# Patient Record
Sex: Male | Born: 1975 | Race: Black or African American | Hispanic: No | Marital: Single | Smoking: Former smoker
Health system: Southern US, Community
[De-identification: ages and names within clinical notes are randomized; demographics above are authoritative.]

## PROBLEM LIST (undated history)

## (undated) DIAGNOSIS — K219 Gastro-esophageal reflux disease without esophagitis: Secondary | ICD-10-CM

## (undated) HISTORY — PX: FEMUR FRACTURE SURGERY: SHX633

---

## 2014-01-30 ENCOUNTER — Encounter (HOSPITAL_COMMUNITY): Payer: Self-pay | Admitting: *Deleted

## 2014-01-30 ENCOUNTER — Emergency Department (HOSPITAL_COMMUNITY)
Admission: EM | Admit: 2014-01-30 | Discharge: 2014-01-30 | Disposition: A | Discharge status: Home / Self Care | Payer: Self-pay | Attending: Emergency Medicine | Admitting: Emergency Medicine

## 2014-01-30 DIAGNOSIS — K219 Gastro-esophageal reflux disease without esophagitis: Secondary | ICD-10-CM | POA: Insufficient documentation

## 2014-01-30 DIAGNOSIS — R109 Unspecified abdominal pain: Secondary | ICD-10-CM | POA: Insufficient documentation

## 2014-01-30 DIAGNOSIS — Z79899 Other long term (current) drug therapy: Secondary | ICD-10-CM | POA: Insufficient documentation

## 2014-01-30 DIAGNOSIS — R112 Nausea with vomiting, unspecified: Secondary | ICD-10-CM | POA: Insufficient documentation

## 2014-01-30 HISTORY — DX: Gastro-esophageal reflux disease without esophagitis: K21.9

## 2014-01-30 LAB — URINALYSIS, ROUTINE W REFLEX MICROSCOPIC
Bilirubin Urine: NEGATIVE
Glucose, UA: NEGATIVE mg/dL
Ketones, ur: NEGATIVE mg/dL
LEUKOCYTES UA: NEGATIVE
Nitrite: NEGATIVE
Protein, ur: 100 mg/dL — AB
SPECIFIC GRAVITY, URINE: 1.027 (ref 1.005–1.030)
UROBILINOGEN UA: 0.2 mg/dL (ref 0.0–1.0)
pH: 7.5 (ref 5.0–8.0)

## 2014-01-30 LAB — CBC WITH DIFFERENTIAL/PLATELET
BASOS PCT: 0 % (ref 0–1)
Basophils Absolute: 0 10*3/uL (ref 0.0–0.1)
Eosinophils Absolute: 0.1 10*3/uL (ref 0.0–0.7)
Eosinophils Relative: 1 % (ref 0–5)
HCT: 41.2 % (ref 39.0–52.0)
Hemoglobin: 13.9 g/dL (ref 13.0–17.0)
Lymphocytes Relative: 21 % (ref 12–46)
Lymphs Abs: 1.9 10*3/uL (ref 0.7–4.0)
MCH: 30.3 pg (ref 26.0–34.0)
MCHC: 33.7 g/dL (ref 30.0–36.0)
MCV: 90 fL (ref 78.0–100.0)
Monocytes Absolute: 1 10*3/uL (ref 0.1–1.0)
Monocytes Relative: 11 % (ref 3–12)
NEUTROS PCT: 67 % (ref 43–77)
Neutro Abs: 6.1 10*3/uL (ref 1.7–7.7)
Platelets: 213 10*3/uL (ref 150–400)
RBC: 4.58 MIL/uL (ref 4.22–5.81)
RDW: 12.7 % (ref 11.5–15.5)
WBC: 9.1 10*3/uL (ref 4.0–10.5)

## 2014-01-30 LAB — URINE MICROSCOPIC-ADD ON

## 2014-01-30 LAB — COMPREHENSIVE METABOLIC PANEL
ALBUMIN: 3.8 g/dL (ref 3.5–5.2)
ALT: 24 U/L (ref 0–53)
ANION GAP: 13 (ref 5–15)
AST: 25 U/L (ref 0–37)
Alkaline Phosphatase: 45 U/L (ref 39–117)
BUN: 18 mg/dL (ref 6–23)
CO2: 25 mEq/L (ref 19–32)
Calcium: 9.3 mg/dL (ref 8.4–10.5)
Chloride: 100 mEq/L (ref 96–112)
Creatinine, Ser: 0.75 mg/dL (ref 0.50–1.35)
GFR calc Af Amer: >90 mL/min (ref >90)
GFR calc non Af Amer: >90 mL/min (ref >90)
Glucose, Bld: 133 mg/dL — ABNORMAL HIGH (ref 70–99)
POTASSIUM: 4.5 meq/L (ref 3.7–5.3)
SODIUM: 138 meq/L (ref 137–147)
TOTAL PROTEIN: 7.1 g/dL (ref 6.0–8.3)
Total Bilirubin: 0.3 mg/dL (ref 0.3–1.2)

## 2014-01-30 LAB — LIPASE, BLOOD: Lipase: 21 U/L (ref 11–59)

## 2014-01-30 MED ORDER — PROMETHAZINE HCL 25 MG PO TABS: 25.0000 mg | ORAL_TABLET | Freq: Four times a day (QID) | ORAL | Status: AC | PRN

## 2014-01-30 MED ORDER — ONDANSETRON HCL 4 MG/2ML IJ SOLN
4.0000 mg | Freq: Once | INTRAMUSCULAR | Status: AC
  Administered 2014-01-30: 4 mg via INTRAVENOUS
  Filled 2014-01-30: qty 2

## 2014-01-30 MED ORDER — SODIUM CHLORIDE 0.9 % IV BOLUS (SEPSIS)
1000.0000 mL | Freq: Once | INTRAVENOUS | Status: AC
  Administered 2014-01-30: 1000 mL via INTRAVENOUS

## 2014-01-30 MED ORDER — KETOROLAC TROMETHAMINE 30 MG/ML IJ SOLN
30.0000 mg | Freq: Once | INTRAMUSCULAR | Status: AC
Start: 2014-01-30 — End: 2014-01-30
  Administered 2014-01-30: 30 mg via INTRAVENOUS
  Filled 2014-01-30: qty 1

## 2014-01-30 NOTE — ED Provider Notes (Signed)
CSN: [id number]     Arrival date & time 01/30/14  0501 History   First MD Initiated Contact with Patient 01/30/14 [phone]     Chief Complaint  Patient presents with  . Headache  . Emesis     (Consider location/radiation/quality/duration/timing/severity/associated sxs/prior Treatment) HPI Comments: Patient is a 38 year old male who presents with complaints of nausea, vomiting, and diarrhea that woke him abruptly from sleep several hours ago. He states he woke with his abdomen feeling "firm", then had to go to the bathroom and vomit. He denies any fevers or chills. He denies any bloody emesis or stool.  Patient is a 38 y.o. male presenting with vomiting. The history is provided by the patient.  Emesis Severity:  Moderate Duration:  2 hours Timing:  Constant Progression:  Improving Chronicity:  New Relieved by:  Nothing Worsened by:  Nothing tried Ineffective treatments:  None tried Associated symptoms: abdominal pain     Past Medical History  Diagnosis Date  . Acid reflux    History reviewed. No pertinent past surgical history. History reviewed. No pertinent family history. History  Substance Use Topics  . Smoking status: Not on file  . Smokeless tobacco: Not on file  . Alcohol Use: No    Review of Systems  Gastrointestinal: Positive for vomiting and abdominal pain.  All other systems reviewed and are negative.     Allergies  Eggs or egg-derived products  Home Medications   Prior to Admission medications   Medication Sig Start Date End Date Taking? Authorizing Provider  lansoprazole (PREVACID) 15 MG capsule Take 15 mg by mouth daily.   Yes Historical Provider, MD   BP 137/78 mmHg  Pulse 75  Temp(Src) 98.6 F (37 C) (Oral)  Resp 18  Ht 5\' 11"  (1.803 m)  SpO2 95% Physical Exam  Constitutional: He is oriented to person, place, and time. He appears well-developed and well-nourished. No distress.  HENT:  Head: Normocephalic and atraumatic.  Mouth/Throat:  Oropharynx is clear and moist.  Neck: Normal range of motion. Neck supple.  Cardiovascular: Normal rate, regular rhythm and normal heart sounds.   No murmur heard. Pulmonary/Chest: Effort normal and breath sounds normal. No respiratory distress. He has no wheezes.  Abdominal: Soft. Bowel sounds are normal. He exhibits no distension. There is tenderness. There is no rebound and no guarding.  There is mild tenderness to palpation in the suprapubic region and right lower quadrant. There is no rebound and no guarding.  Musculoskeletal: Normal range of motion. He exhibits no edema.  Lymphadenopathy:    He has no cervical adenopathy.  Neurological: He is alert and oriented to person, place, and time.  Skin: Skin is warm and dry. He is not diaphoretic.  Nursing note and vitals reviewed.   ED Course  Procedures (including critical care time) Labs Review Labs Reviewed  CBC WITH DIFFERENTIAL  COMPREHENSIVE METABOLIC PANEL  LIPASE, BLOOD  URINALYSIS, ROUTINE W REFLEX MICROSCOPIC    Imaging Review No results found.   EKG Interpretation None      MDM   Final diagnoses:  None    Patient presents with nausea, vomiting, and diarrhea. His presentation, exam, and workup is consistent with a viral etiology. He is feeling better with fluids and medications and I believe is appropriate for discharge. He understands to return if his symptoms worsen or change.    Geoffery Lyonsouglas Yatzari Jonsson, MD 01/30/14 [phone]

## 2014-01-30 NOTE — Discharge Instructions (Signed)
Phenergan as needed for nausea.  Return to the emergency department if you develop worsening pain, high fever, bloody stool, or other new and concerning symptoms.   Abdominal Pain Many things can cause belly (abdominal) pain. Most times, the belly pain is not dangerous. Many cases of belly pain can be watched and treated at home. HOME CARE   Do not take medicines that help you go poop (laxatives) unless told to by your doctor.  Only take medicine as told by your doctor.  Eat or drink as told by your doctor. Your doctor will tell you if you should be on a special diet. GET HELP IF:  You do not know what is causing your belly pain.  You have belly pain while you are sick to your stomach (nauseous) or have runny poop (diarrhea).  You have pain while you pee or poop.  Your belly pain wakes you up at night.  You have belly pain that gets worse or better when you eat.  You have belly pain that gets worse when you eat fatty foods.  You have a fever. GET HELP RIGHT AWAY IF:   The pain does not go away within 2 hours.  You keep throwing up (vomiting).  The pain changes and is only in the right or left part of the belly.  You have bloody or tarry looking poop. MAKE SURE YOU:   Understand these instructions.  Will watch your condition.  Will get help right away if you are not doing well or get worse. Document Released: 08/26/2007 Document Revised: 03/14/2013 Document Reviewed: 11/16/2012 [facility] Patient Information 2015 [place] [place]. This information is not intended to replace advice given to you by your health care provider. Make sure you discuss any questions you have with your health care provider.

## 2014-01-30 NOTE — ED Notes (Signed)
Pt reports waking up this am with heavy sensation to mid abd and having n/v/d.

## 2014-03-08 ENCOUNTER — Ambulatory Visit: Payer: Self-pay | Admitting: Internal Medicine

## 2014-05-21 ENCOUNTER — Emergency Department (HOSPITAL_COMMUNITY)
Admission: EM | Admit: 2014-05-21 | Discharge: 2014-05-21 | Disposition: A | Discharge status: Home / Self Care | Payer: Self-pay | Attending: Emergency Medicine | Admitting: Emergency Medicine

## 2014-05-21 ENCOUNTER — Encounter (HOSPITAL_COMMUNITY): Payer: Self-pay | Admitting: Emergency Medicine

## 2014-05-21 ENCOUNTER — Emergency Department (HOSPITAL_COMMUNITY): Payer: Self-pay

## 2014-05-21 DIAGNOSIS — X58XXXA Exposure to other specified factors, initial encounter: Secondary | ICD-10-CM | POA: Insufficient documentation

## 2014-05-21 DIAGNOSIS — Y9289 Other specified places as the place of occurrence of the external cause: Secondary | ICD-10-CM | POA: Insufficient documentation

## 2014-05-21 DIAGNOSIS — Y998 Other external cause status: Secondary | ICD-10-CM | POA: Insufficient documentation

## 2014-05-21 DIAGNOSIS — Y9389 Activity, other specified: Secondary | ICD-10-CM | POA: Insufficient documentation

## 2014-05-21 DIAGNOSIS — S93402A Sprain of unspecified ligament of left ankle, initial encounter: Secondary | ICD-10-CM | POA: Insufficient documentation

## 2014-05-21 DIAGNOSIS — Z8781 Personal history of (healed) traumatic fracture: Secondary | ICD-10-CM | POA: Insufficient documentation

## 2014-05-21 DIAGNOSIS — K219 Gastro-esophageal reflux disease without esophagitis: Secondary | ICD-10-CM | POA: Insufficient documentation

## 2014-05-21 MED ORDER — TRAMADOL HCL 50 MG PO TABS: 50.0000 mg | ORAL_TABLET | Freq: Four times a day (QID) | ORAL | Status: AC | PRN

## 2014-05-21 MED ORDER — IBUPROFEN 600 MG PO TABS: 600.0000 mg | ORAL_TABLET | Freq: Four times a day (QID) | ORAL | Status: AC | PRN

## 2014-05-21 NOTE — ED Notes (Addendum)
Pt reports swelling and pain to L ankle since last Tuesday. Swelling decreased after taking ibuprofen but has returned. No known injury. Pt with rod in L femur. Denies fevers, chills. PMS intact.

## 2014-05-21 NOTE — ED Provider Notes (Signed)
CSN: [id number]     Arrival date & time 05/21/14  1734 History   First MD Initiated Contact with Patient 05/21/14 1820     Chief Complaint  Patient presents with  . Ankle Pain     (Consider location/radiation/quality/duration/timing/severity/associated sxs/prior Treatment) HPI Pt is a 39yo male presenting to ED with c/o left ankle pain and associated left ankle swelling, gradually worsening over 1 week. Pt states he has been taking ibuprofen with mild to moderate relief, but swelling keeps coming back. Pain is aching and sore, 7/10 at worst, worse with weight bearing and ambulation.  Denies numbness in foot. No known falls or other injuries.  Denies previous surgery to ankle but reports having a rod in his left femur for about 10 years w/o previous complications.  Denies fever or chills. No wound to area. No hx of gout. Denies calf or knee pain.   Past Medical History  Diagnosis Date  . Acid reflux    Past Surgical History  Procedure Laterality Date  . Femur fracture surgery     No family history on file. History  Substance Use Topics  . Smoking status: Not on file  . Smokeless tobacco: Not on file  . Alcohol Use: No    Review of Systems  Constitutional: Negative for fever and chills.  Musculoskeletal: Positive for myalgias, joint swelling and arthralgias. Negative for back pain.       Left ankle  Skin: Negative for color change and wound.  Neurological: Negative for weakness and numbness.  All other systems reviewed and are negative.     Allergies  Eggs or egg-derived products  Home Medications   Prior to Admission medications   Medication Sig Start Date End Date Taking? Authorizing Provider  ibuprofen (ADVIL,MOTRIN) 600 MG tablet Take 1 tablet (600 mg total) by mouth every 6 (six) hours as needed. 05/21/14   Junius Finner, PA-C  lansoprazole (PREVACID) 15 MG capsule Take 15 mg by mouth daily.    Historical Provider, MD  promethazine (PHENERGAN) 25 MG tablet Take 1  tablet (25 mg total) by mouth every 6 (six) hours as needed for nausea. 01/30/14   Geoffery Lyons, MD  traMADol (ULTRAM) 50 MG tablet Take 1 tablet (50 mg total) by mouth every 6 (six) hours as needed. 05/21/14   Junius Finner, PA-C   BP 120/65 mmHg  Pulse 77  Temp(Src) 98.6 F (37 C) (Oral)  Resp 18  Ht  (1.803 m)  Wt 225 lb (102.059 kg)  BMI 31.39 kg/m2  SpO2 95% Physical Exam  Constitutional: He is oriented to person, place, and time. He appears well-developed and well-nourished.  HENT:  Head: Normocephalic and atraumatic.  Eyes: EOM are normal.  Neck: Normal range of motion.  Cardiovascular: Normal rate.   Pulses:      Dorsalis pedis pulses are 2+ on the left side.       Posterior tibial pulses are 2+ on the left side.  Left foot: cap refill <3 seconds  Pulmonary/Chest: Effort normal.  Musculoskeletal: Normal range of motion. He exhibits edema and tenderness.  Left ankle: moderate edema, tenderness to medial aspect, worse along lateral aspect. No obvious deformity. FROM. 4/5 plantarflexion and dorsiflexion compared to right ankle.  No calf tenderness. FROM left knee.  No knee tenderness.  Neurological: He is alert and oriented to person, place, and time.  Left foot: sensation to light and sharp touch in tact  Skin: Skin is warm and dry. No erythema.  Skin in tact.  No ecchymosis, erythema, or warmth. No red streaking, induration, or evidence of underlying infection  Psychiatric: He has a normal mood and affect. His behavior is normal.  Nursing note and vitals reviewed.   ED Course  Procedures (including critical care time) Labs Review Labs Reviewed - No data to display  Imaging Review Dg Ankle Complete Left  05/21/2014   CLINICAL DATA:  Left ankle pain/swelling, no known injury  EXAM: LEFT ANKLE COMPLETE - 3+ VIEW  COMPARISON:  None.  FINDINGS: No fracture or dislocation is seen.  The ankle mortise is intact.  Mild lateral soft tissue swelling.  IMPRESSION: No fracture  or dislocation is seen.  Mild lateral soft tissue swelling.   Electronically Signed   By: Charline BillsSriyesh  Krishnan M.D.   On: 05/21/2014 20:03     EKG Interpretation None      MDM   Final diagnoses:  Left ankle sprain, initial encounter   Pt presenting to ED with c/o left ankle pain and swelling for 1 week. Left foot is neurovascularly in tact. Skin in tact, no evidence of underlying infection. Doubt septic joint or gout.  Plain films: no fracture or dislocation. Mild lateral soft tissue swelling.   Will tx as sprain. ASO applied, crutches provided. Rx: tramadol and ibuprofen. Home care instructions provided. Advised to f/u with PCP in 1 week for recheck of symptoms if not improving.  Pt verbalized understanding and agreement with tx plan.  Junius Finnerrin O'Malley, PA-C 05/22/14 0023  Gerhard Munchobert Lockwood, MD 05/22/14 Jacinta [id number]

## 2014-05-21 NOTE — Discharge Instructions (Signed)

## 2014-05-21 NOTE — ED Notes (Signed)
Called pt twice for triage  with no answer  

## 2014-05-21 NOTE — ED Notes (Signed)
Pt reports pain and swelling to Lt knee for 1 week.

## 2014-07-16 ENCOUNTER — Encounter (HOSPITAL_COMMUNITY): Payer: Self-pay | Admitting: Emergency Medicine

## 2014-07-16 ENCOUNTER — Emergency Department (HOSPITAL_COMMUNITY)
Admission: EM | Admit: 2014-07-16 | Discharge: 2014-07-16 | Disposition: A | Discharge status: Home / Self Care | Payer: Self-pay | Attending: Emergency Medicine | Admitting: Emergency Medicine

## 2014-07-16 DIAGNOSIS — R202 Paresthesia of skin: Secondary | ICD-10-CM | POA: Insufficient documentation

## 2014-07-16 DIAGNOSIS — Z79899 Other long term (current) drug therapy: Secondary | ICD-10-CM | POA: Insufficient documentation

## 2014-07-16 DIAGNOSIS — Z87891 Personal history of nicotine dependence: Secondary | ICD-10-CM | POA: Insufficient documentation

## 2014-07-16 DIAGNOSIS — K219 Gastro-esophageal reflux disease without esophagitis: Secondary | ICD-10-CM | POA: Insufficient documentation

## 2014-07-16 LAB — CBG MONITORING, ED: Glucose-Capillary: 109 mg/dL — ABNORMAL HIGH (ref 70–99)

## 2014-07-16 NOTE — ED Notes (Signed)
CBG 109 

## 2014-07-16 NOTE — ED Provider Notes (Signed)
CSN: [id number]     Arrival date & time 07/16/14  1749 History  This chart was scribed for non-physician practitioner, Felicie Mornavid Leo Fray, NP working with Doug SouSam Jacubowitz, MD by Greggory StallionKayla Andersen, ED scribe. This patient was seen in room TR04C/TR04C and the patient's care was started at 7:48 PM.   Chief Complaint  Patient presents with  . Numbness   The history is provided by the patient. No language interpreter was used.    HPI Comments: Jesse Conley is a 39 y.o. male who presents to the Emergency Department complaining of right fourth and fifth toe numbness that started 2 weeks ago. He denies any injury but states he wears steel toed boots at work. Pt has not yet done anything for his symptoms. He denies personal history of diabetes but states there is a family history.   Past Medical History  Diagnosis Date  . Acid reflux    Past Surgical History  Procedure Laterality Date  . Femur fracture surgery     No family history on file. History  Substance Use Topics  . Smoking status: Former Games developermoker  . Smokeless tobacco: Not on file  . Alcohol Use: No    Review of Systems  Neurological: Positive for numbness.  All other systems reviewed and are negative.  Allergies  Eggs or egg-derived products  Home Medications   Prior to Admission medications   Medication Sig Start Date End Date Taking? Authorizing Provider  lansoprazole (PREVACID) 15 MG capsule Take 15 mg by mouth daily.   Yes Historical Provider, MD  ibuprofen (ADVIL,MOTRIN) 600 MG tablet Take 1 tablet (600 mg total) by mouth every 6 (six) hours as needed. Patient not taking: Reported on 07/16/2014 05/21/14   Junius FinnerErin O'Malley, PA-C  promethazine (PHENERGAN) 25 MG tablet Take 1 tablet (25 mg total) by mouth every 6 (six) hours as needed for nausea. Patient not taking: Reported on 07/16/2014 01/30/14   Geoffery Lyonsouglas Delo, MD  traMADol (ULTRAM) 50 MG tablet Take 1 tablet (50 mg total) by mouth every 6 (six) hours as needed. Patient not taking:  Reported on 07/16/2014 05/21/14   Junius FinnerErin O'Malley, PA-C   BP 137/77 mmHg  Pulse 87  Temp(Src) 98.5 F (36.9 C) (Oral)  Resp 16  Ht 5\' 11"  (1.803 m)  Wt 226 lb (102.513 kg)  BMI 31.53 kg/m2  SpO2 96%   Physical Exam  Constitutional: He is oriented to person, place, and time. He appears well-developed and well-nourished. No distress.  HENT:  Head: Normocephalic and atraumatic.  Eyes: Conjunctivae and EOM are normal.  Neck: Neck supple. No tracheal deviation present.  Cardiovascular: Normal rate.   Pulmonary/Chest: Effort normal. No respiratory distress.  Musculoskeletal: Normal range of motion.  Neurological: He is alert and oriented to person, place, and time.  Able to differentiate between sharp and dull touch in right foot.   Skin: Skin is warm and dry.  Psychiatric: He has a normal mood and affect. His behavior is normal.  Nursing note and vitals reviewed.   ED Course  Procedures (including critical care time)  DIAGNOSTIC STUDIES: Oxygen Saturation is 96% on RA, normal by my interpretation.    COORDINATION OF CARE: 7:52 PM-Discussed treatment plan which includes checking sugar level with pt at bedside and pt agreed to plan. [facility] and advised him to follow up.  Labs Review Labs Reviewed  CBG MONITORING, ED - Abnormal; Notable for the following:    Glucose-Capillary 109 (*)  All other components within normal limits    Imaging Review No results found.   EKG Interpretation None     Patient presents with complaint of numbness of lateral aspect of right foot and his 4th and 5th toes. Denies any other areas of numbness. Patient states he wears loose fitting safety boots at work. No injury or indication of infection. Sensation and movement intact, and is able to differentiate between sharp and dull stimuli in all areas of his foot with the exception of a calloused area to lateral aspect of the foot, plantar surface.  Family  history of diabetes.  Random glucose in ED of 109.   MDM   Final diagnoses:  None   Hypoesthesia of right foot.  Follow-up with [facility]/W center, with potential evaluation by neurology if symptoms persist.  I personally performed the services described in this documentation, which was scribed in my presence. The recorded information has been reviewed and is accurate.   Felicie Morn, NP 07/17/14 1610  Doug Sou, MD 07/17/14 [phone]

## 2014-07-16 NOTE — Discharge Instructions (Signed)

## 2014-07-16 NOTE — ED Notes (Addendum)
Pt st's he has had numbness in right 4th and 5th toes and lat side of foot x's 2 weeks.  No other complaints voiced. Pt denies injury

## 2016-09-06 IMAGING — DX DG ANKLE COMPLETE 3+V*L*
3 series · 3 of 3 positions shown · non-contrast
Comparison: None.

CLINICAL DATA: Left ankle pain/swelling, no known injury

EXAM:
LEFT ANKLE COMPLETE - 3+ VIEW

[ankle ap]
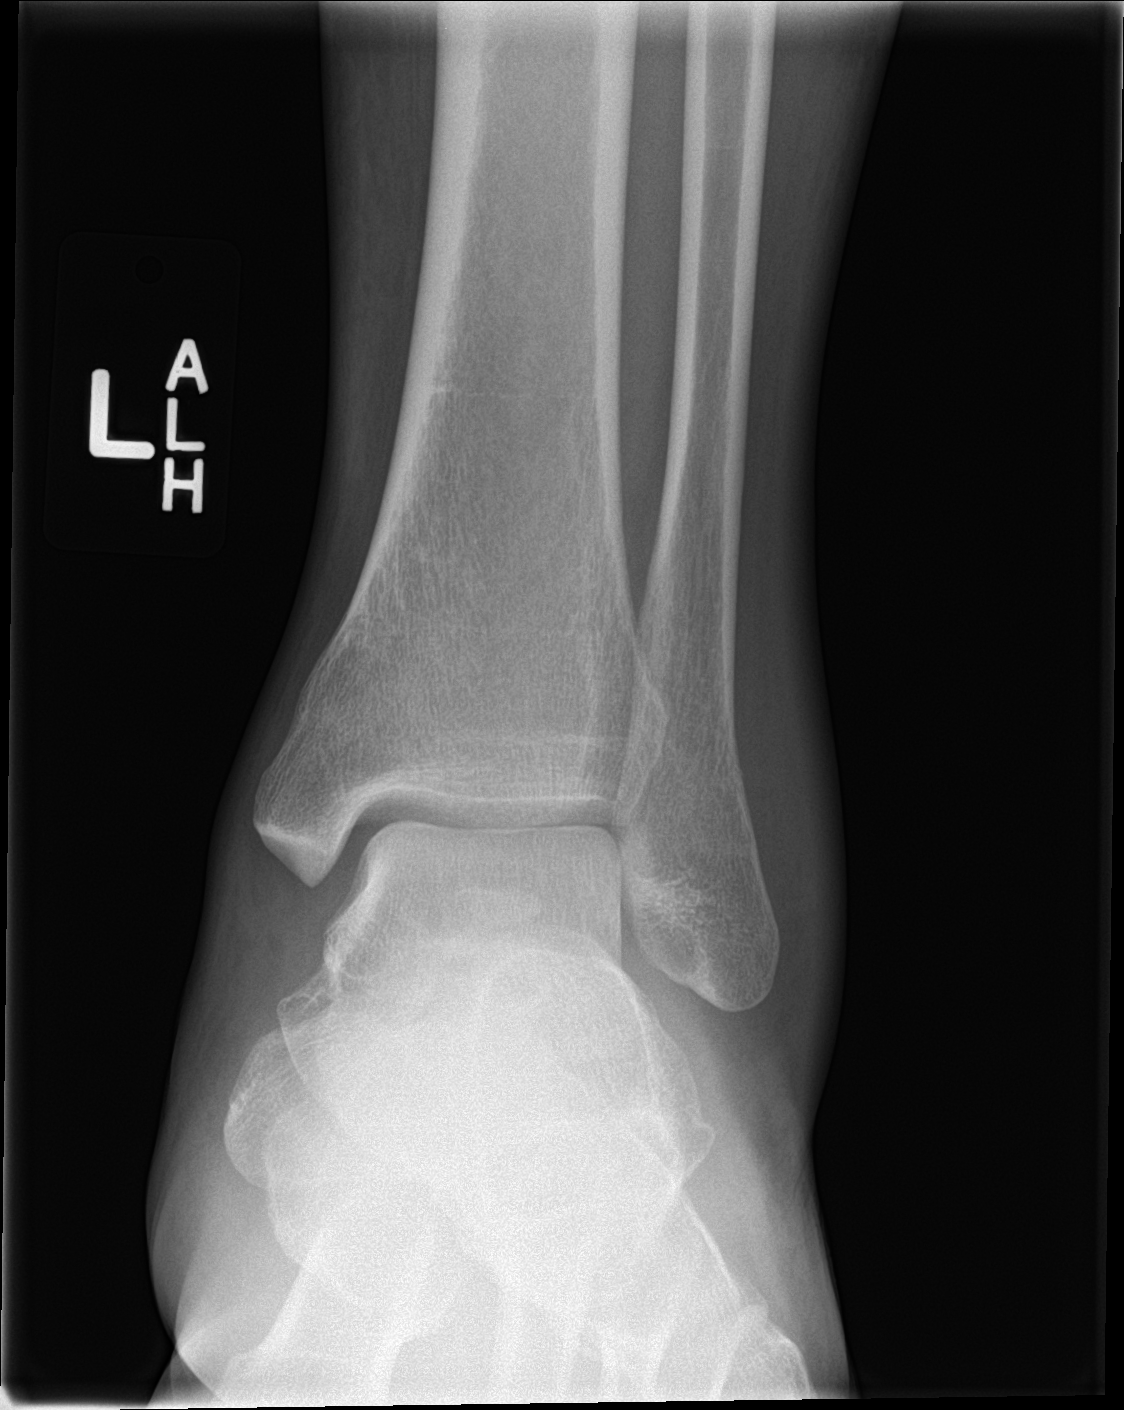

[ankle obl]
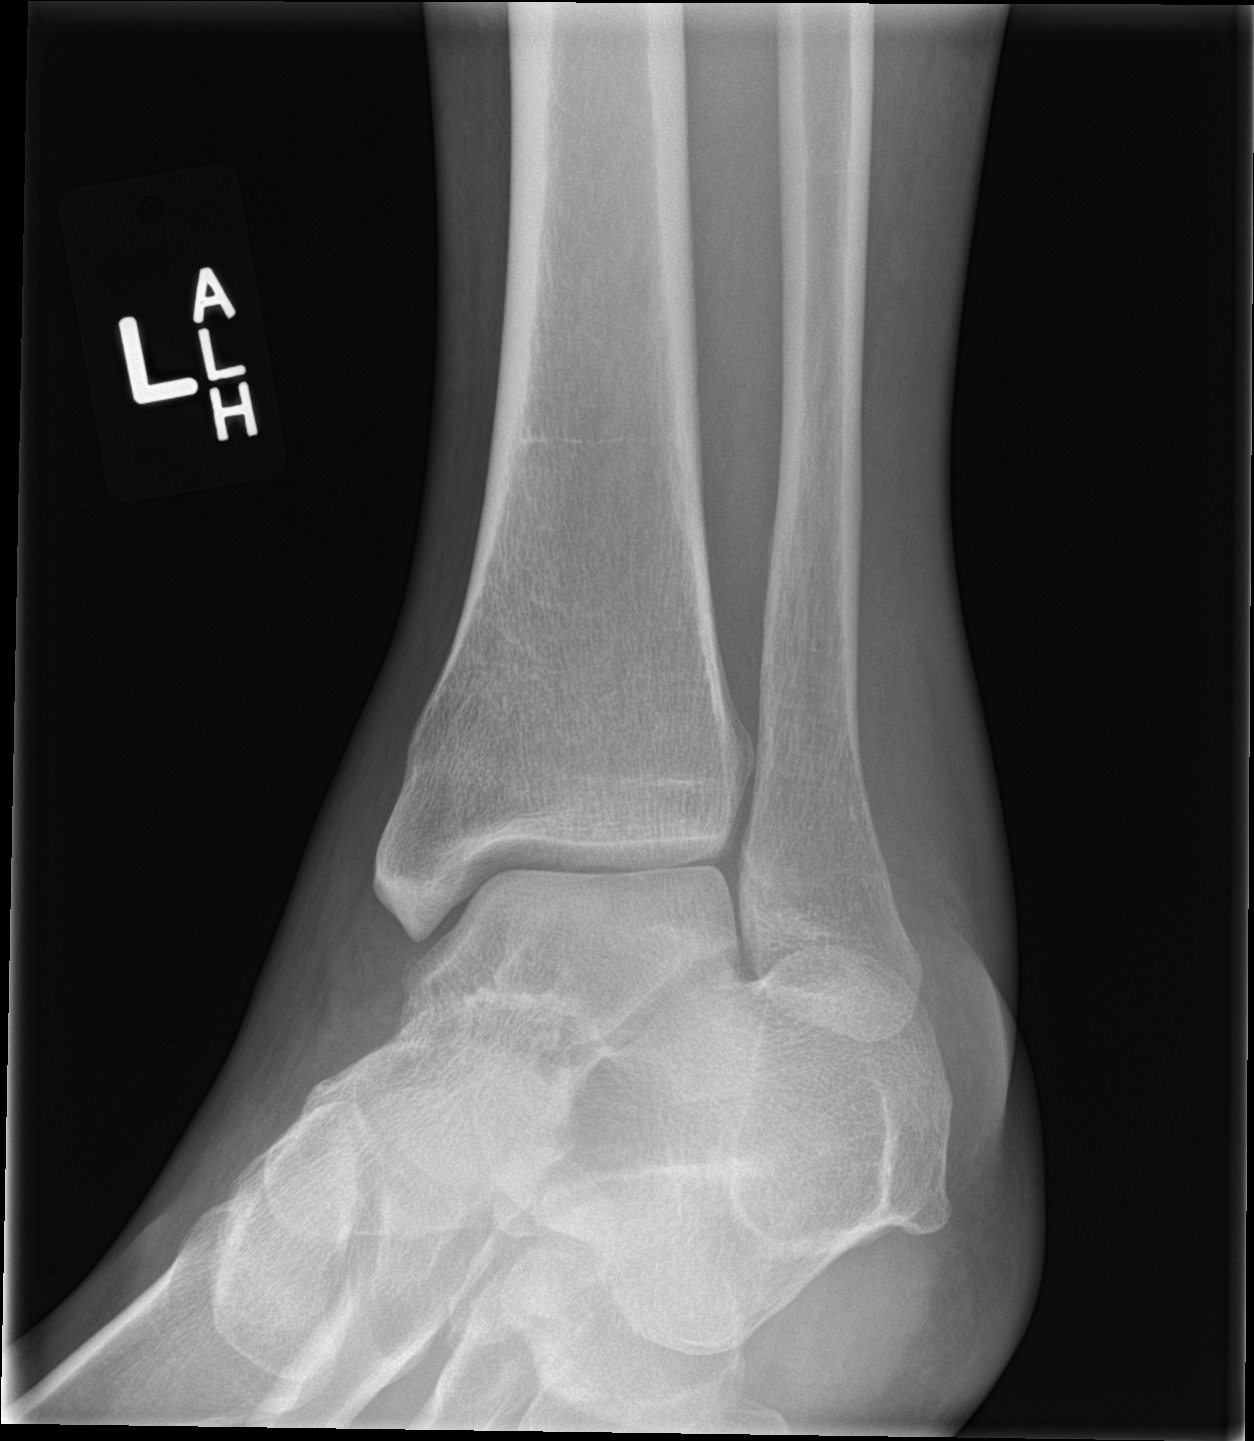

[ankle lat]
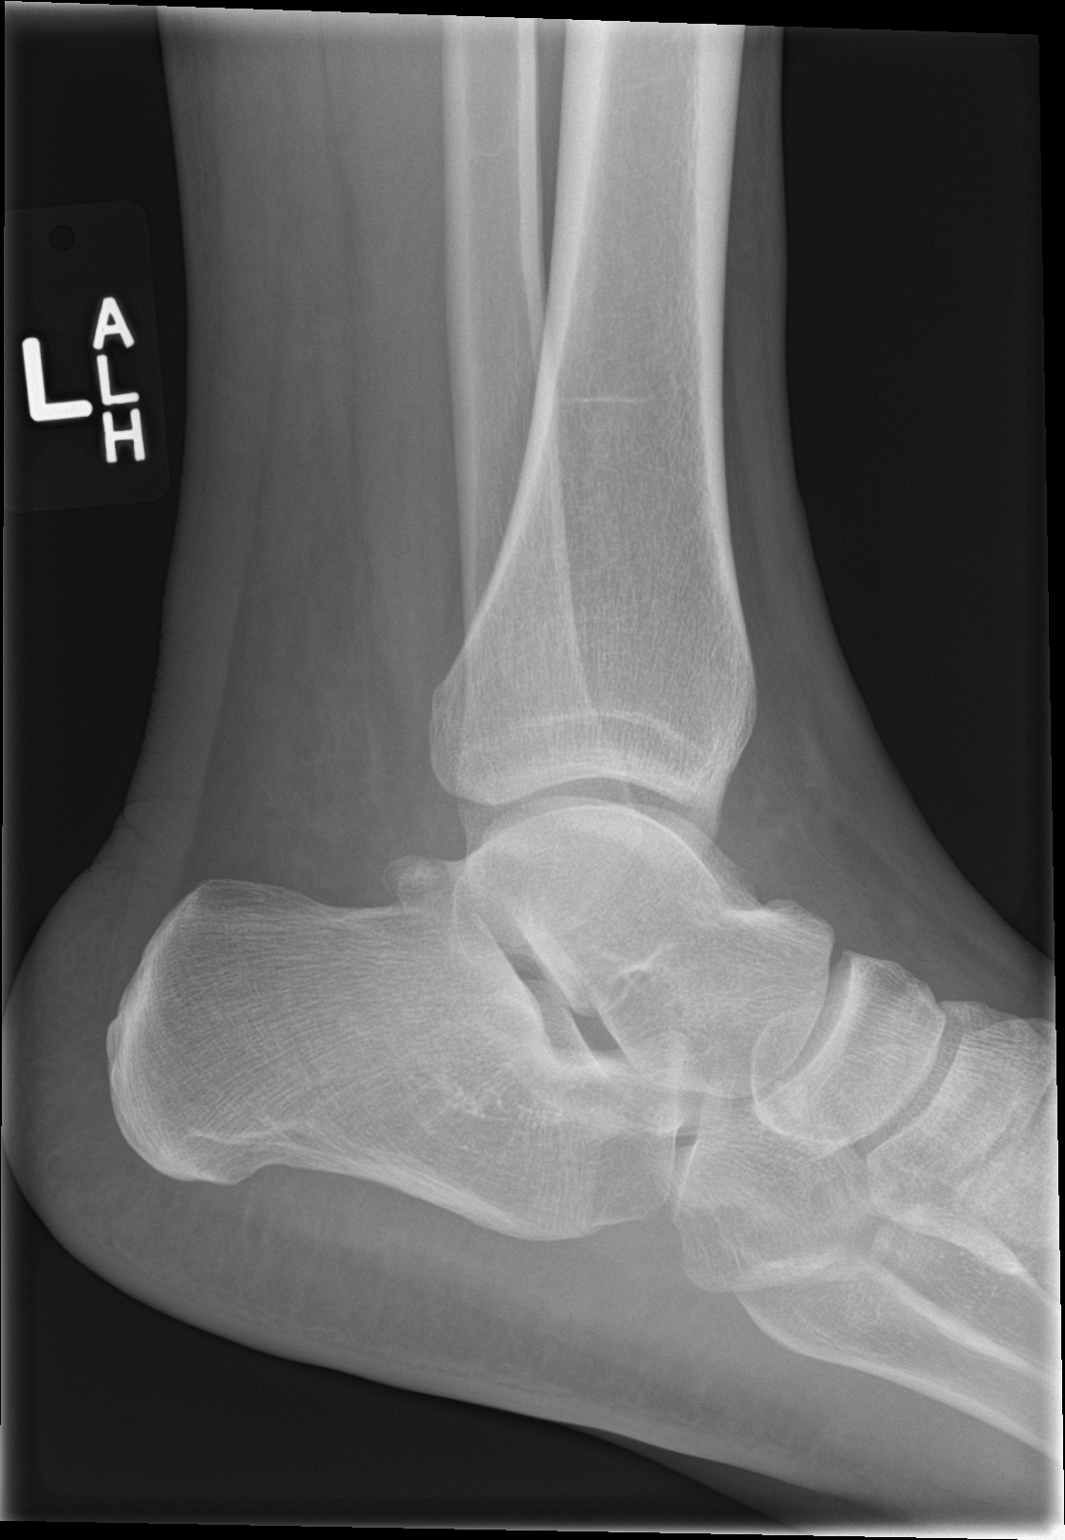

[3 of 3 positions shown; findings below may reference images not displayed]

FINDINGS: No fracture or dislocation is seen.

The ankle mortise is intact.

Mild lateral soft tissue swelling.
IMPRESSION: No fracture or dislocation is seen.

Mild lateral soft tissue swelling.
